# Patient Record
Sex: Female | Born: 1975 | Race: White | Hispanic: No | Marital: Single | Smoking: Current every day smoker
Health system: Southern US, Community
[De-identification: ages and names within clinical notes are randomized; demographics above are authoritative.]

---

## 2004-10-06 ENCOUNTER — Observation Stay: Payer: Self-pay

## 2004-12-04 ENCOUNTER — Ambulatory Visit: Payer: Self-pay | Admitting: Obstetrics & Gynecology

## 2004-12-05 ENCOUNTER — Ambulatory Visit: Payer: Self-pay

## 2004-12-31 ENCOUNTER — Observation Stay: Payer: Self-pay | Admitting: Unknown Physician Specialty

## 2005-01-10 ENCOUNTER — Inpatient Hospital Stay: Payer: Self-pay | Admitting: Unknown Physician Specialty

## 2006-11-09 ENCOUNTER — Emergency Department: Payer: Self-pay | Admitting: Unknown Physician Specialty

## 2006-12-20 ENCOUNTER — Emergency Department: Payer: Self-pay | Admitting: Emergency Medicine

## 2006-12-21 ENCOUNTER — Emergency Department: Payer: Self-pay | Admitting: Emergency Medicine

## 2009-07-28 ENCOUNTER — Emergency Department: Payer: Self-pay | Admitting: Unknown Physician Specialty

## 2011-06-03 ENCOUNTER — Ambulatory Visit: Payer: Self-pay | Admitting: Family Medicine

## 2012-06-10 ENCOUNTER — Ambulatory Visit: Payer: Self-pay | Admitting: Internal Medicine

## 2014-03-01 ENCOUNTER — Ambulatory Visit: Payer: Self-pay | Admitting: Internal Medicine

## 2014-04-12 ENCOUNTER — Emergency Department: Payer: Self-pay | Admitting: Emergency Medicine

## 2014-04-12 LAB — CBC WITH DIFFERENTIAL/PLATELET
Basophil #: 0 10*3/uL (ref 0.0–0.1)
Basophil %: 0.3 %
EOS ABS: 0.1 10*3/uL (ref 0.0–0.7)
Eosinophil %: 1 %
HCT: 39.7 % (ref 35.0–47.0)
HGB: 12.8 g/dL (ref 12.0–16.0)
Lymphocyte #: 1.9 10*3/uL (ref 1.0–3.6)
Lymphocyte %: 16.2 %
MCH: 28.1 pg (ref 26.0–34.0)
MCHC: 32.1 g/dL (ref 32.0–36.0)
MCV: 87 fL (ref 80–100)
MONOS PCT: 3.7 %
Monocyte #: 0.4 x10 3/mm (ref 0.2–0.9)
Neutrophil #: 9.1 10*3/uL — ABNORMAL HIGH (ref 1.4–6.5)
Neutrophil %: 78.8 %
PLATELETS: 214 10*3/uL (ref 150–440)
RBC: 4.54 10*6/uL (ref 3.80–5.20)
RDW: 16.1 % — ABNORMAL HIGH (ref 11.5–14.5)
WBC: 11.6 10*3/uL — AB (ref 3.6–11.0)

## 2014-04-12 LAB — BASIC METABOLIC PANEL
Anion Gap: 8 (ref 7–16)
BUN: 10 mg/dL (ref 7–18)
Calcium, Total: 8.5 mg/dL (ref 8.5–10.1)
Chloride: 108 mmol/L — ABNORMAL HIGH (ref 98–107)
Co2: 24 mmol/L (ref 21–32)
Creatinine: 0.73 mg/dL (ref 0.60–1.30)
EGFR (African American): >60
EGFR (Non-African Amer.): >60
Glucose: 108 mg/dL — ABNORMAL HIGH (ref 65–99)
Osmolality: 279 (ref 275–301)
Potassium: 3.6 mmol/L (ref 3.5–5.1)
SODIUM: 140 mmol/L (ref 136–145)

## 2014-04-26 ENCOUNTER — Ambulatory Visit: Payer: Self-pay | Admitting: Obstetrics & Gynecology

## 2014-04-26 LAB — CBC
HCT: 38.9 % (ref 35.0–47.0)
HGB: 12.5 g/dL (ref 12.0–16.0)
MCH: 28 pg (ref 26.0–34.0)
MCHC: 32.1 g/dL (ref 32.0–36.0)
MCV: 87 fL (ref 80–100)
PLATELETS: 218 10*3/uL (ref 150–440)
RBC: 4.46 10*6/uL (ref 3.80–5.20)
RDW: 15.9 % — ABNORMAL HIGH (ref 11.5–14.5)
WBC: 11.5 10*3/uL — ABNORMAL HIGH (ref 3.6–11.0)

## 2014-05-03 ENCOUNTER — Ambulatory Visit: Payer: Self-pay | Admitting: Obstetrics & Gynecology

## 2014-05-08 LAB — PATHOLOGY REPORT

## 2015-01-03 IMAGING — US US PELV - US TRANSVAGINAL
1 series · 13 of 25 positions shown · non-contrast
Comparison: None

CLINICAL DATA: Menorrhagia



[Series 1: us pelv - us transvaginal · 0.22mm/px · 83 acquisitions, 13 frames shown]
[im 1/83]
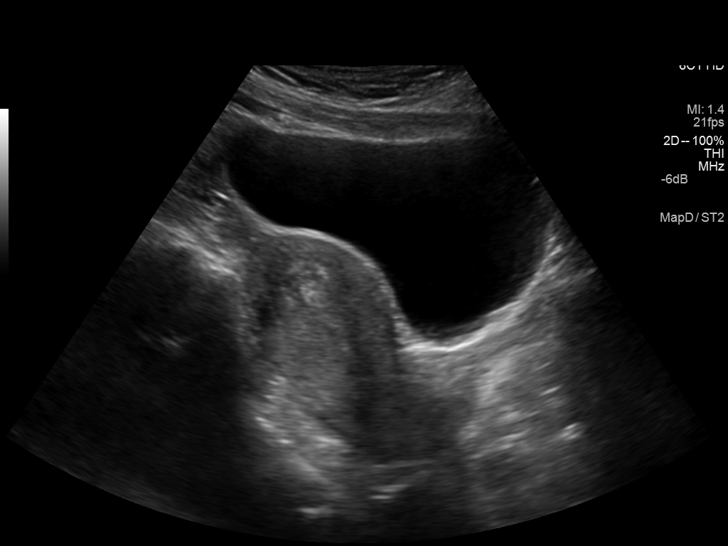
[im 7/83]
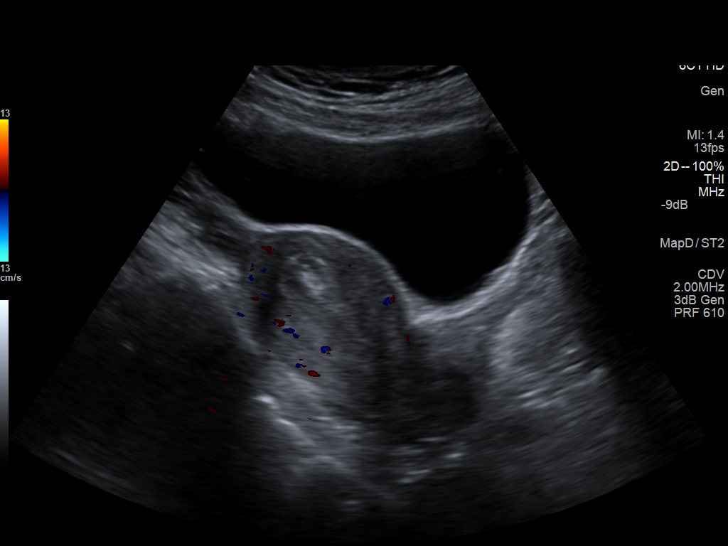
[im 14/83]
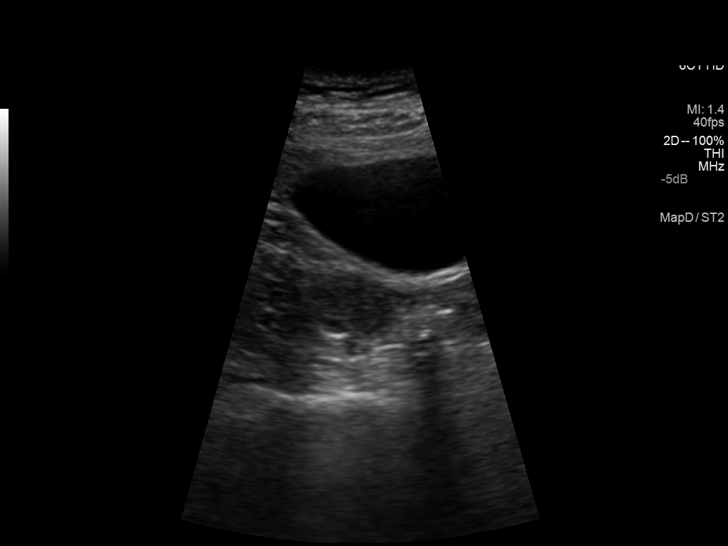
[im 21/83]
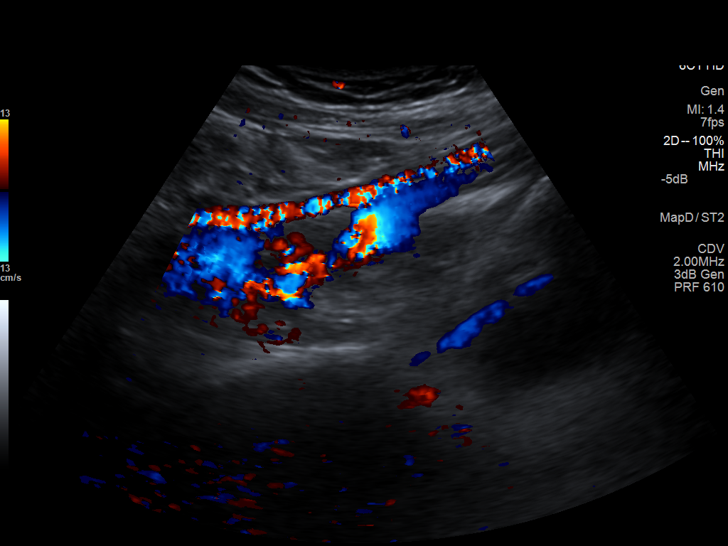
[im 28/83]
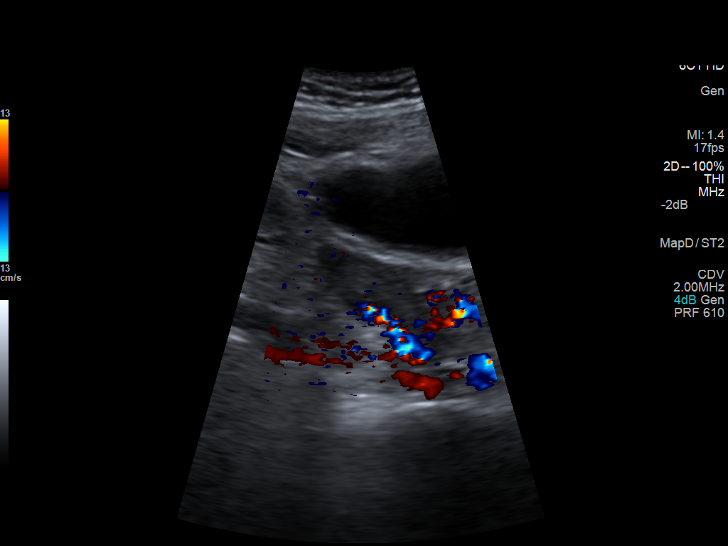
[im 35/83]
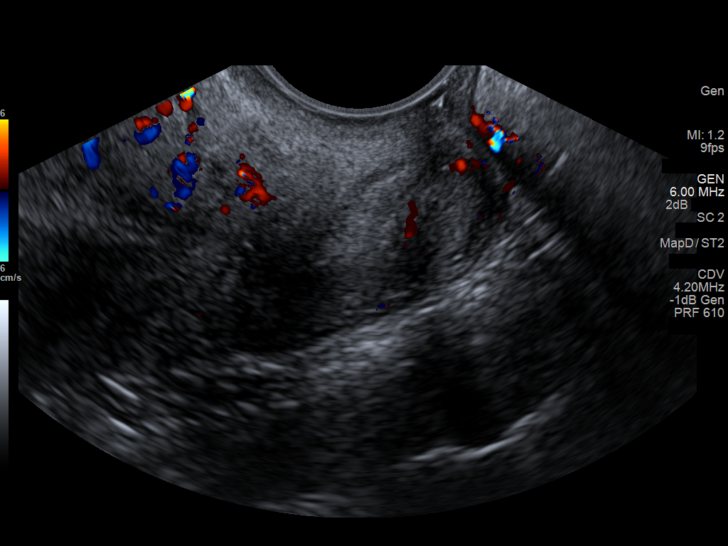
[im 42/83]
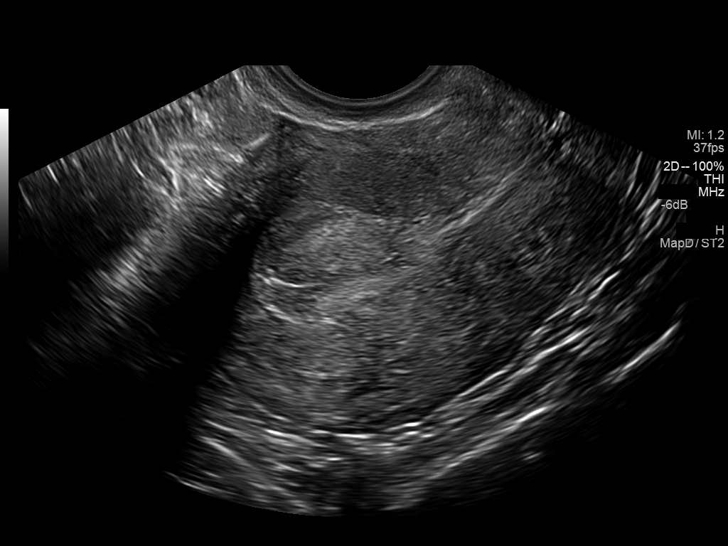
[im 48/83]
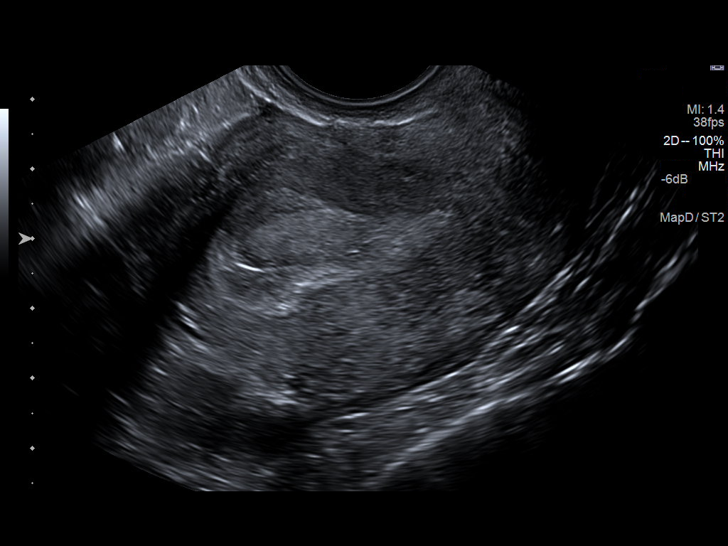
[im 55/83]
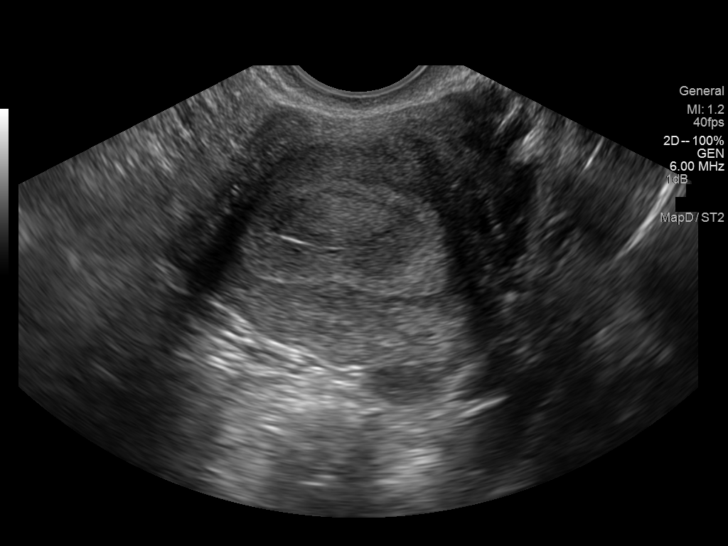
[im 62/83]
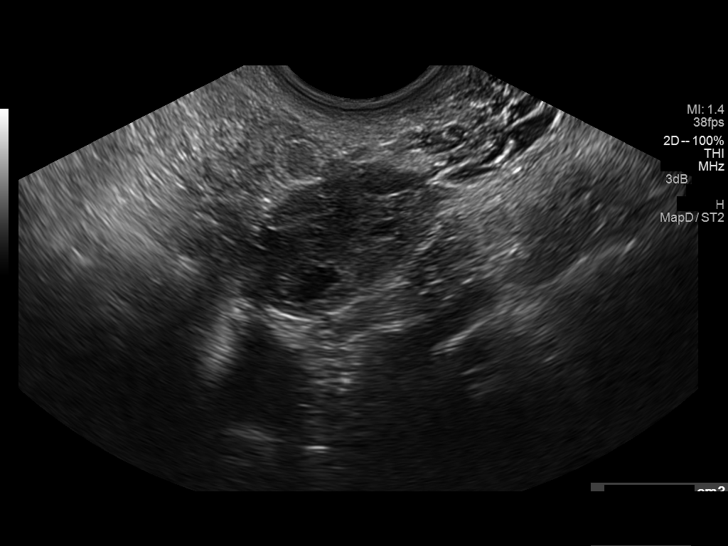
[im 69/83]
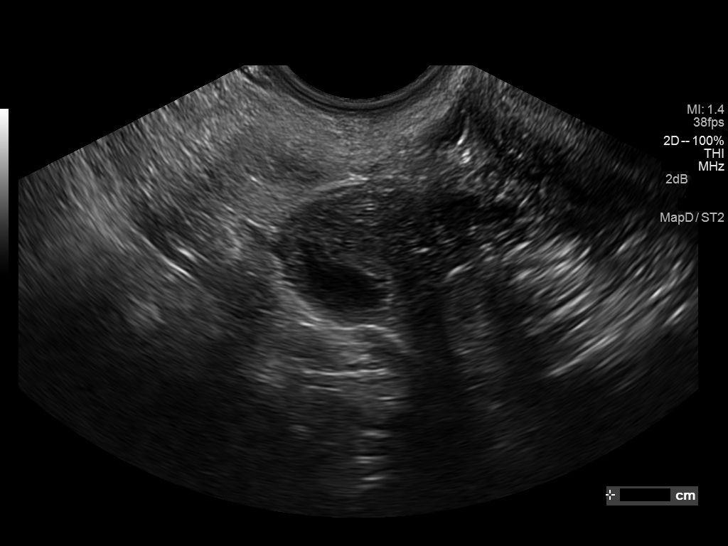
[im 76/83]
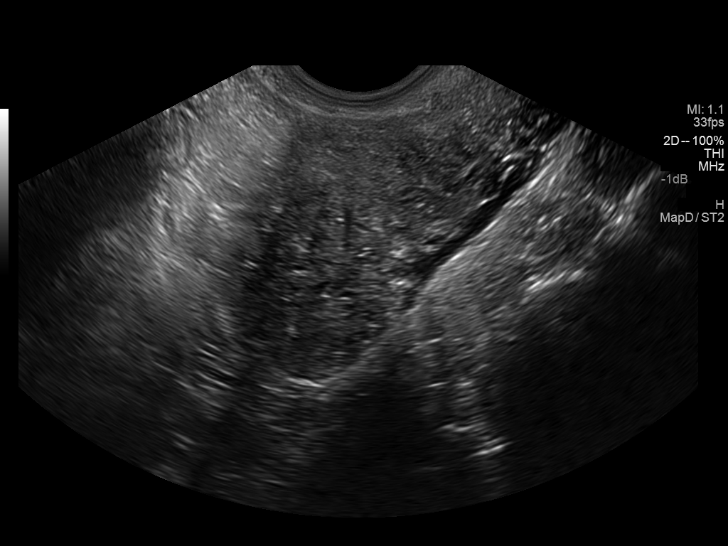
[im 83/83]
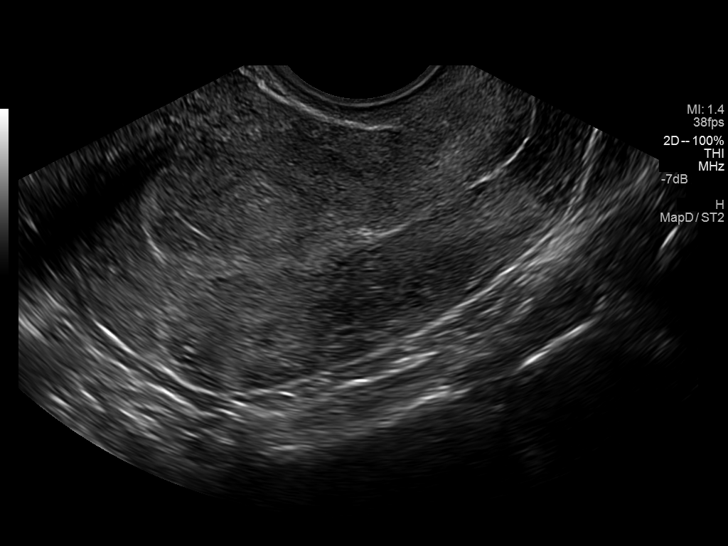

[13 of 25 positions shown; findings below may reference images not displayed]

FINDINGS: Uterus

Measurements: 8.8 x 4.3 x 5.2 cm. No fibroids or other mass
visualized.

Endometrium

Thickness: 15 mm. Suspected 1.6 x 0.9 x 1.5 cm focal hyperechoic
endometrial lesion.

Right ovary

Measurements: 2.8 x 2.1 x 3.1 cm. 1.6 cm corpus luteal cyst, benign.

Left ovary

Measurements: 2.3 x 2.0 x 2.3 cm. Normal appearance/no adnexal mass.

Other findings

No free fluid.
IMPRESSION: Suspected 16 mm focal hyperechoic endometrial lesion, suspicious for
endometrial polyp.

Consider further evaluation with sonohysterogram for confirmation
prior to hysteroscopy. Endometrial sampling should also be
considered if patient is at high risk for endometrial carcinoma.

(Ref: Radiological Reasoning: Algorithmic Workup of Abnormal Vaginal
Bleeding with Endovaginal Sonography and Sonohysterography. AJR
4006; 191:S68-73)

## 2015-01-12 NOTE — Op Note (Signed)
PATIENT NAME:  Grace BucklesSPIRE, Twylla F MR#:  [id number] DATE OF BIRTH:  27-Mar-1976  DATE OF PROCEDURE:  05/03/2014  PREOPERATIVE DIAGNOSIS: Menorrhagia and polyps.   POSTOPERATIVE DIAGNOSIS: Menorrhagia and polyps.  PROCEDURE PERFORMED: Hysteroscopy, dilation and curettage procedure with polypectomy and endometrial ablation using NovaSure.   SURGEON: Annamarie MajorPaul Harris, MD   ASSISTANT: None.   ANESTHESIA: General.   ESTIMATED BLOOD LOSS: Minimal.   COMPLICATIONS: None.   FINDINGS: Multiple intrauterine polyps.   DISPOSITION: To the recovery room in stable condition.   TECHNIQUE: The patient is prepped and draped in the usual sterile fashion. After adequate anesthesia is obtained in the dorsal lithotomy position, bladder is drained with a Robinson catheter. Speculum is placed and the anterior lip of the cervix is grasped with a tenaculum. The cervix is dilated to size a 18 Pratt dilator. Endocervical curettage is performed with a Kevorkian curette. Visualization with the hysteroscope and lactated Ringer fluid to distend the intrauterine cavity is performed with the above-mentioned findings visualized. It is switched to glycine fluid and the resectoscope is inserted with resection of multiple polyps from the lining of the uterus. Once this is cleared, a determination is made to continue with the ablation procedure. Lactated Ringer solution is again used to flush out the lining of the uterus.   Sounding length is 8 cm and cervical length is 3 cm, giving a cavity length of 5 cm. The NovaSure device is placed without difficulty and a cavity width is measured at 4.5 cm. Endometrial ablation is then tested with cavitation test followed by the actual procedure that takes 1 minute and 31 seconds under a power of 124. The NovaSure device is then removed without complication. Repeat hysteroscopy reveals a blanching of the lining of the uterus. Hysteroscope is removed. There is minimal discrepancy of fluid at any  time and at the conclusion of the case. The tenaculum is removed with excellent hemostasis noted. The patient goes to the recovery room in stable condition. All sponge, instrument and needle counts are correct.    ____________________________ R. Annamarie MajorPaul Harris, MD rph:TT D: 05/03/2014 17:16:37 ET T: 05/03/2014 17:43:33 ET JOB#: [id number]  cc: Dierdre Searles. Paul Harris, MD, <Dictator> Nadara MustardOBERT P HARRIS MD ELECTRONICALLY SIGNED 05/04/2014 0:40

## 2017-06-28 ENCOUNTER — Emergency Department: Payer: Self-pay

## 2017-06-28 ENCOUNTER — Encounter: Payer: Self-pay | Admitting: Emergency Medicine

## 2017-06-28 ENCOUNTER — Emergency Department
Admission: EM | Admit: 2017-06-28 | Discharge: 2017-06-28 | Disposition: A | Discharge status: Home / Self Care | Payer: Self-pay | Attending: Emergency Medicine | Admitting: Emergency Medicine

## 2017-06-28 DIAGNOSIS — B9789 Other viral agents as the cause of diseases classified elsewhere: Secondary | ICD-10-CM | POA: Insufficient documentation

## 2017-06-28 DIAGNOSIS — F17200 Nicotine dependence, unspecified, uncomplicated: Secondary | ICD-10-CM | POA: Insufficient documentation

## 2017-06-28 DIAGNOSIS — J069 Acute upper respiratory infection, unspecified: Secondary | ICD-10-CM | POA: Insufficient documentation

## 2017-06-28 MED ORDER — IPRATROPIUM-ALBUTEROL 0.5-2.5 (3) MG/3ML IN SOLN
3.0000 mL | Freq: Once | RESPIRATORY_TRACT | Status: AC
  Administered 2017-06-28: 3 mL via RESPIRATORY_TRACT
  Filled 2017-06-28: qty 3

## 2017-06-28 MED ORDER — ALBUTEROL SULFATE HFA 108 (90 BASE) MCG/ACT IN AERS: 2.0000 | INHALATION_SPRAY | Freq: Four times a day (QID) | RESPIRATORY_TRACT | 0 refills | Status: AC | PRN

## 2017-06-28 MED ORDER — FLUTICASONE PROPIONATE 50 MCG/ACT NA SUSP: 2.0000 | Freq: Every day | NASAL | 0 refills | Status: AC

## 2017-06-28 MED ORDER — DEXAMETHASONE SODIUM PHOSPHATE 10 MG/ML IJ SOLN
10.0000 mg | Freq: Once | INTRAMUSCULAR | Status: AC
  Administered 2017-06-28: 10 mg via INTRAMUSCULAR
  Filled 2017-06-28: qty 1

## 2017-06-28 MED ORDER — BENZONATATE 100 MG PO CAPS: 100.0000 mg | ORAL_CAPSULE | Freq: Three times a day (TID) | ORAL | 0 refills | Status: AC | PRN

## 2017-06-28 NOTE — Discharge Instructions (Signed)
Your exam and CXR are normal today. Your symptoms are consistent with a viral infection, at worst, or a flare of your allergies causing a bronchospasm (cough). Take the prescription meds as directed. Start Delsym and Mucinex for cough relief. Follow-up with a local community clinic as needed.

## 2017-06-28 NOTE — ED Triage Notes (Signed)
Pt with cold sx for one week

## 2017-06-28 NOTE — ED Notes (Signed)
Pt discharged home after verbalizing understanding of discharge instructions; nad noted. 

## 2017-06-28 NOTE — ED Provider Notes (Signed)
[facility] Emergency Department Provider Note ____________________________________________  Time seen: 1551  I have reviewed the triage vital signs and the nursing notes.  HISTORY  Chief Complaint  URI  HPI Grace Fleming is a 41 y.o. female Presents to the ED for a one-week complaint of intermittent cough and cold symptoms. Patient denies any fevers, chills, or sweats. She describes some mild wheezing but a non-productive cough. She has dosed DayQuil and NyQuil without significant benefit. She denies sick contacts, recent travel or other exposures.   History reviewed. No pertinent past medical history.  There are no active problems to display for this patient.  History reviewed. No pertinent surgical history.  Prior to Admission medications   Medication Sig Start Date End Date Taking? Authorizing Provider  albuterol (PROVENTIL HFA;VENTOLIN HFA) 108 (90 Base) MCG/ACT inhaler Inhale 2 puffs into the lungs every 6 (six) hours as needed for wheezing or shortness of breath. 06/28/17   Nya Monds, Charlesetta Ivory, PA-C  benzonatate (TESSALON PERLES) 100 MG capsule Take 1 capsule (100 mg total) by mouth 3 (three) times daily as needed for cough (Take 1-2 per dose). 06/28/17   Courtland Reas, Charlesetta Ivory, PA-C  fluticasone (FLONASE) 50 MCG/ACT nasal spray Place 2 sprays into both nostrils daily. 06/28/17   Yittel Emrich, Charlesetta Ivory, PA-C    Allergies Aspirin  No family history on file.  Social History Social History  Substance Use Topics  . Smoking status: Current Every Day Smoker  . Smokeless tobacco: Never Used  . Alcohol use No    Review of Systems  Constitutional: Negative for fever. Eyes: Negative for visual changes. ENT: Negative for sore throat. Cardiovascular: Negative for chest pain. Respiratory: Negative for shortness of breath.  Gastrointestinal: Negative for abdominal pain, vomiting and  diarrhea. ____________________________________________  PHYSICAL EXAM:  VITAL SIGNS: ED Triage Vitals [06/28/17 1404]  Enc Vitals Group     BP 133/79     Pulse Rate 99     Resp 20     Temp 98.2 F (36.8 C)     Temp Source Oral     SpO2 97 %     Weight 100 lb (45.4 kg)     Height      Head Circumference      Peak Flow      Pain Score      Pain Loc      Pain Edu?      Excl. in GC?     Constitutional: Alert and oriented. Well appearing and in no distress. Head: Normocephalic and atraumatic. Eyes: Conjunctivae are normal. Normal extraocular movements Ears: Canals clear. TMs intact bilaterally. Nose: No congestion/rhinorrhea/epistaxis. Cardiovascular: Normal rate, regular rhythm. Normal distal pulses. Respiratory: Normal respiratory effort. No rales/rhonchi. Mild end-expiratory wheezing bilaterally Musculoskeletal: Nontender with normal range of motion in all extremities.  Neurologic:  Normal gait without ataxia. Normal speech and language. No gross focal neurologic deficits are appreciated. Skin:  Skin is warm, dry and intact. No rash noted. ____________________________________________   RADIOLOGY  CXR  IMPRESSION: No active cardiopulmonary disease. ____________________________________________  PROCEDURES  DuoNeb x 1  Decadron 10 mg IM ____________________________________________  INITIAL IMPRESSION / ASSESSMENT AND PLAN / ED COURSE  Patient presents to the ED for evaluation of a non-productive cough for the last week. Her exam is benign, showing some mild expiratory wheezing. Her CXR is negative. She is discharged with prescriptions for Tessalon Perles, Flonase, amd albuterol. She is advised to continue her daily allergy medicine. She should also  start Delsym and Mucinex for symptoms. Follow-up with a local community clinic for ongoing symptoms.  ____________________________________________  FINAL CLINICAL IMPRESSION(S) / ED DIAGNOSES  Final diagnoses:  Viral  URI with cough       Kaloni Bisaillon, Charlesetta Ivory, PA-C 06/28/17 1759    Sharman Cheek, MD 06/28/17 2257

## 2017-06-28 NOTE — ED Notes (Signed)
See triage note.  Pt states she has had cold like symptoms for 1 week with no relief.

## 2018-05-02 IMAGING — CR DG CHEST 2V
1 series · 2 of 2 positions shown · non-contrast
Comparison: None.

CLINICAL DATA: Dry cough.

EXAM:
CHEST  2 VIEW

[Series 1: dg chest 2 view · 0.14mm/px · 2 of 2 slices shown]
[im 1/2]
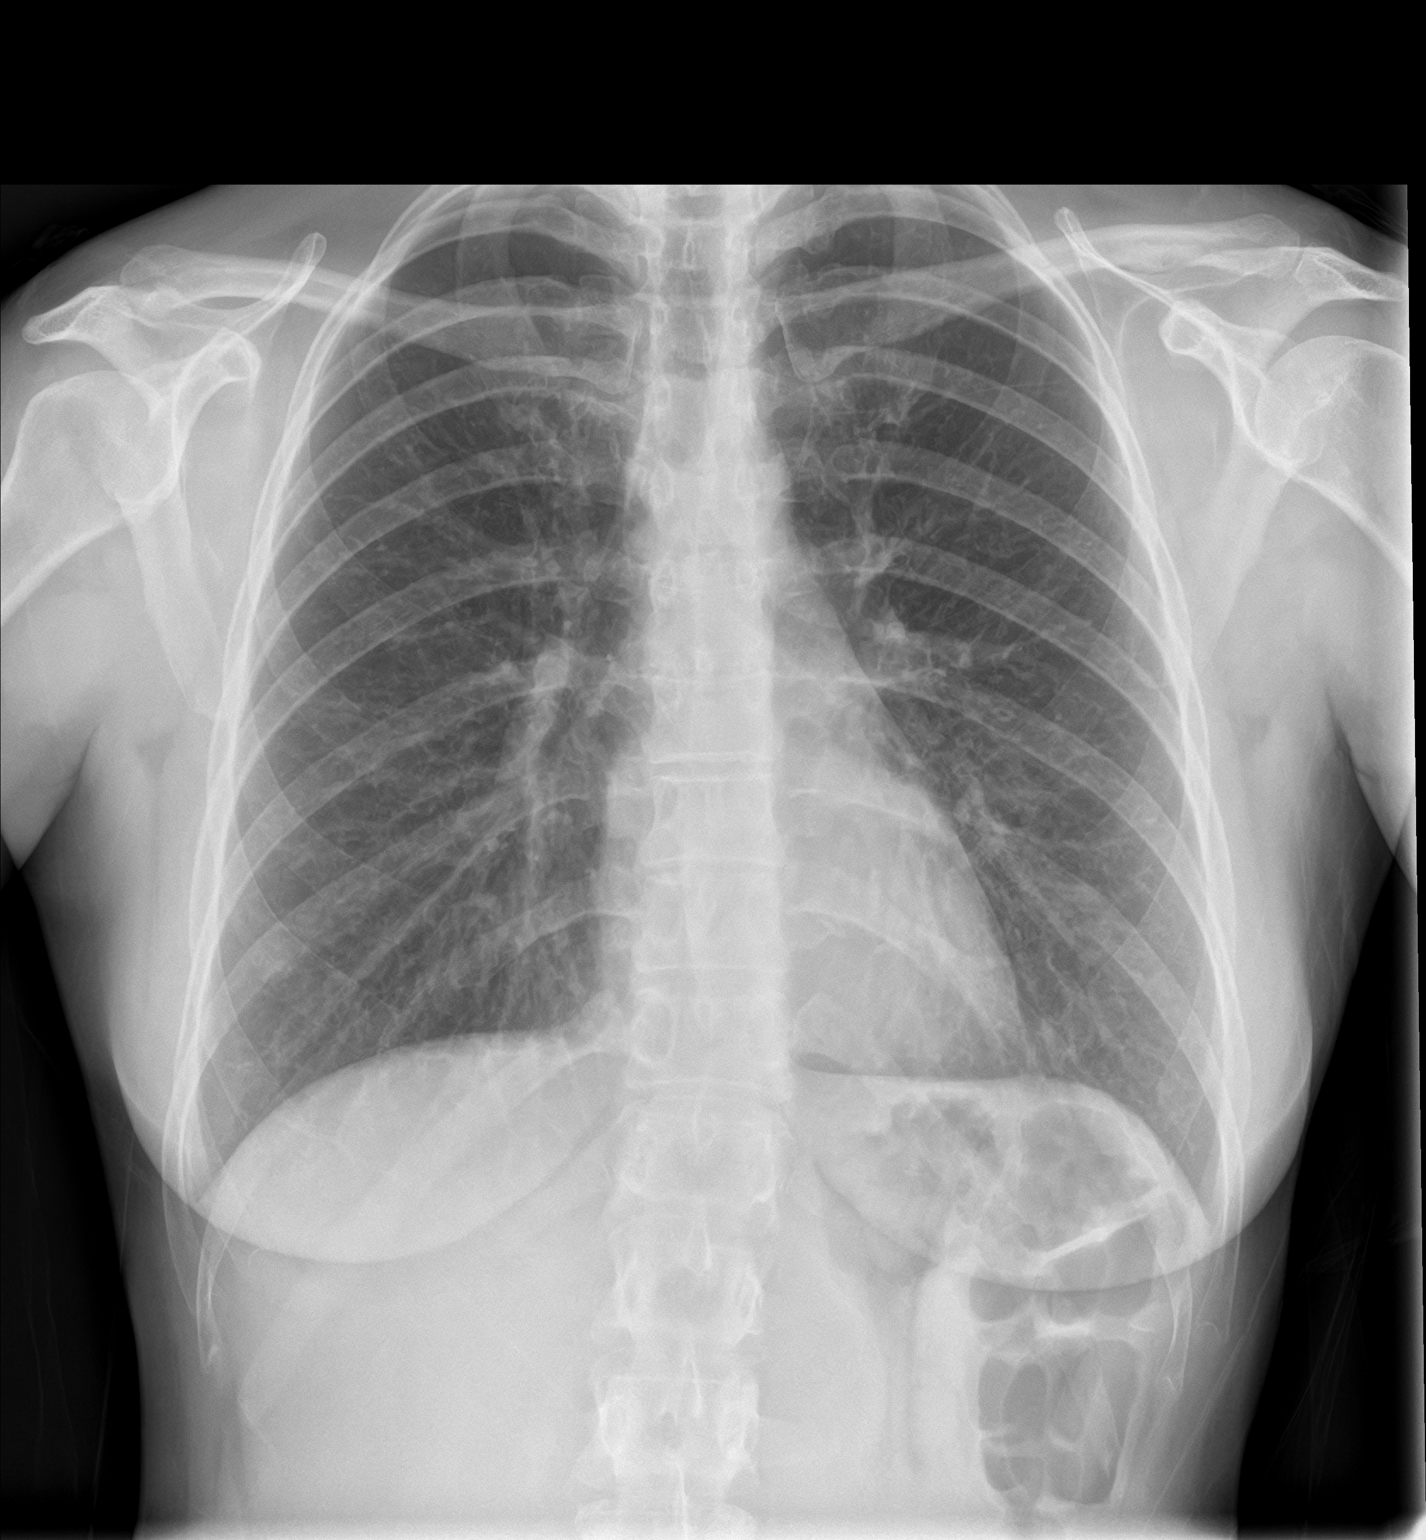
[im 2/2]
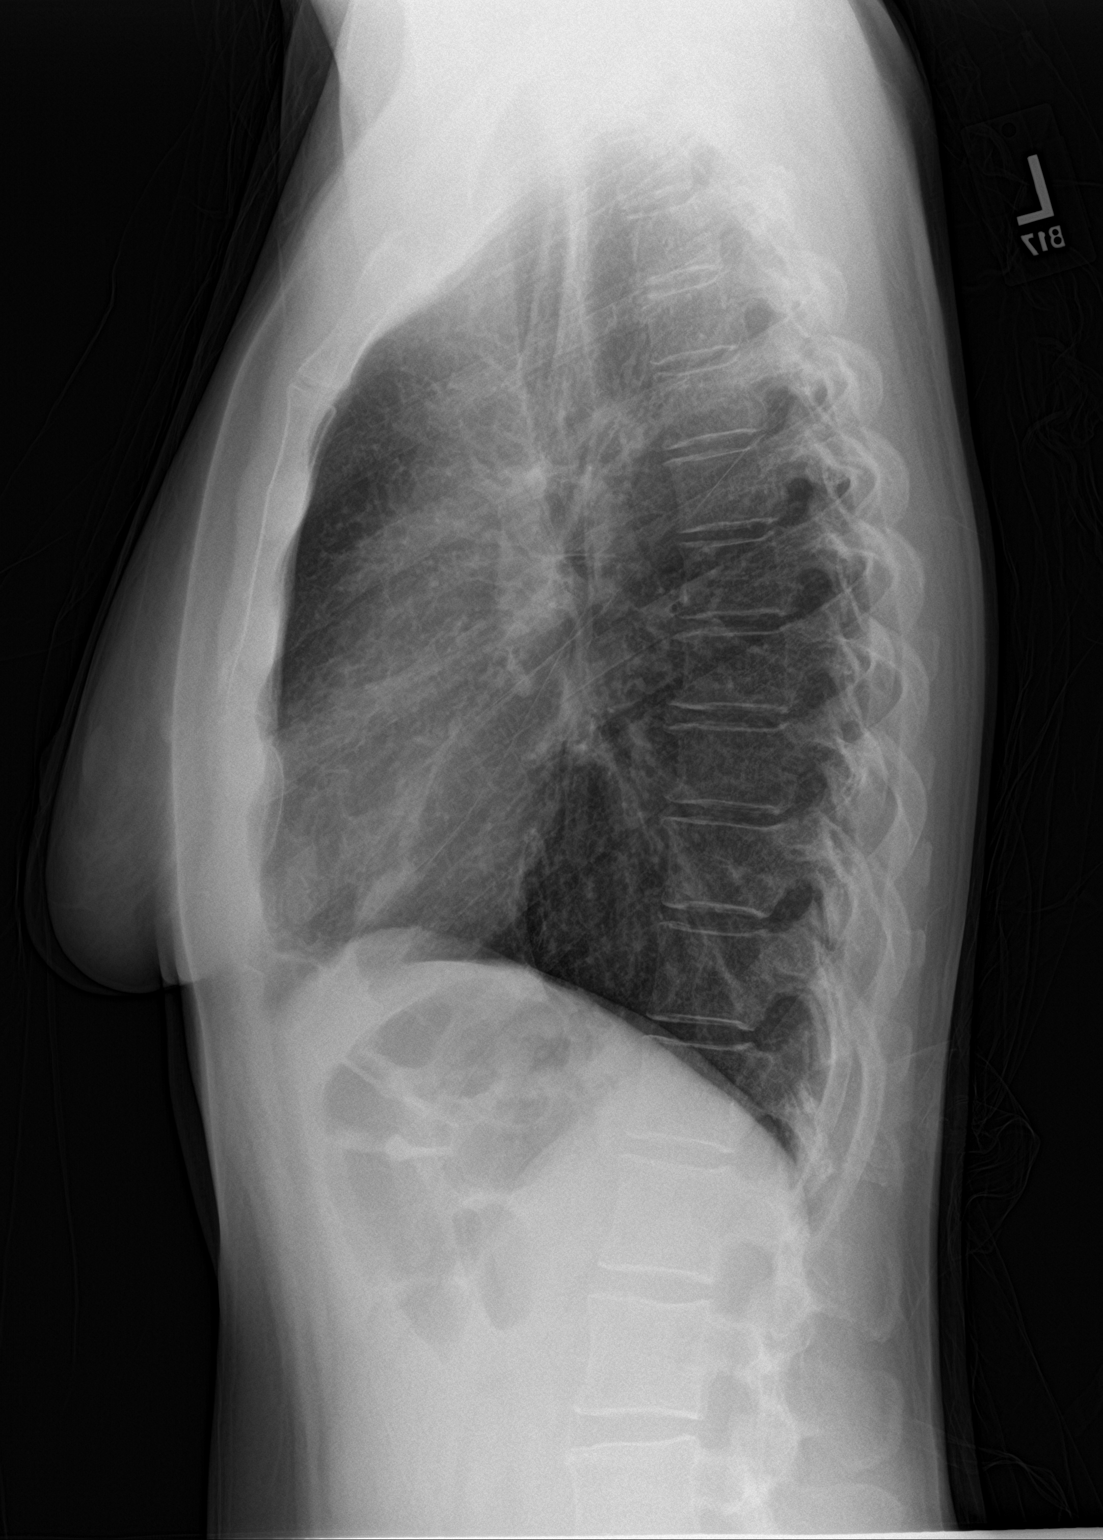

[2 of 2 positions shown; findings below may reference images not displayed]

FINDINGS: The heart size and mediastinal contours are within normal limits.
Both lungs are clear. The visualized skeletal structures are
unremarkable.
IMPRESSION: No active cardiopulmonary disease.
